# Patient Record
Sex: Male | Born: 1975
Health system: Southern US, Community
[De-identification: ages and names within clinical notes are randomized; demographics above are authoritative.]

## PROBLEM LIST (undated history)

## (undated) DIAGNOSIS — K602 Anal fissure, unspecified: Secondary | ICD-10-CM

## (undated) DIAGNOSIS — E039 Hypothyroidism, unspecified: Secondary | ICD-10-CM

## (undated) HISTORY — PX: NASAL SEPTUM SURGERY: SHX37

---

## 2016-01-22 ENCOUNTER — Other Ambulatory Visit: Payer: Self-pay | Admitting: Family Medicine

## 2016-01-22 ENCOUNTER — Ambulatory Visit
Admission: RE | Admit: 2016-01-22 | Discharge: 2016-01-22 | Disposition: A | Discharge status: Home / Self Care | Payer: BLUE CROSS/BLUE SHIELD | Source: Ambulatory Visit | Attending: Family Medicine | Admitting: Family Medicine

## 2016-01-22 DIAGNOSIS — J209 Acute bronchitis, unspecified: Secondary | ICD-10-CM

## 2017-06-03 DIAGNOSIS — L723 Sebaceous cyst: Secondary | ICD-10-CM | POA: Diagnosis not present

## 2017-06-03 DIAGNOSIS — R1909 Other intra-abdominal and pelvic swelling, mass and lump: Secondary | ICD-10-CM | POA: Diagnosis not present

## 2017-06-10 DIAGNOSIS — L723 Sebaceous cyst: Secondary | ICD-10-CM | POA: Diagnosis not present

## 2017-06-17 DIAGNOSIS — E039 Hypothyroidism, unspecified: Secondary | ICD-10-CM | POA: Diagnosis not present

## 2017-08-19 DIAGNOSIS — S60221A Contusion of right hand, initial encounter: Secondary | ICD-10-CM | POA: Diagnosis not present

## 2017-10-10 DIAGNOSIS — Z0000 Encounter for general adult medical examination without abnormal findings: Secondary | ICD-10-CM | POA: Diagnosis not present

## 2017-10-10 DIAGNOSIS — Z136 Encounter for screening for cardiovascular disorders: Secondary | ICD-10-CM | POA: Diagnosis not present

## 2017-10-10 DIAGNOSIS — R740 Nonspecific elevation of levels of transaminase and lactic acid dehydrogenase [LDH]: Secondary | ICD-10-CM | POA: Diagnosis not present

## 2017-10-10 DIAGNOSIS — E039 Hypothyroidism, unspecified: Secondary | ICD-10-CM | POA: Diagnosis not present

## 2017-10-10 DIAGNOSIS — Z23 Encounter for immunization: Secondary | ICD-10-CM | POA: Diagnosis not present

## 2017-12-26 DIAGNOSIS — S31831A Laceration without foreign body of anus, initial encounter: Secondary | ICD-10-CM | POA: Diagnosis not present

## 2017-12-29 DIAGNOSIS — R740 Nonspecific elevation of levels of transaminase and lactic acid dehydrogenase [LDH]: Secondary | ICD-10-CM | POA: Diagnosis not present

## 2018-06-12 DIAGNOSIS — E039 Hypothyroidism, unspecified: Secondary | ICD-10-CM | POA: Diagnosis not present

## 2018-07-02 DIAGNOSIS — K602 Anal fissure, unspecified: Secondary | ICD-10-CM | POA: Diagnosis not present

## 2018-08-21 DIAGNOSIS — E039 Hypothyroidism, unspecified: Secondary | ICD-10-CM | POA: Diagnosis not present

## 2018-10-23 DIAGNOSIS — Z0000 Encounter for general adult medical examination without abnormal findings: Secondary | ICD-10-CM | POA: Diagnosis not present

## 2018-11-04 DIAGNOSIS — K611 Rectal abscess: Secondary | ICD-10-CM | POA: Diagnosis not present

## 2018-11-06 DIAGNOSIS — Z0000 Encounter for general adult medical examination without abnormal findings: Secondary | ICD-10-CM | POA: Diagnosis not present

## 2018-11-06 DIAGNOSIS — E7889 Other lipoprotein metabolism disorders: Secondary | ICD-10-CM | POA: Diagnosis not present

## 2018-11-06 DIAGNOSIS — E039 Hypothyroidism, unspecified: Secondary | ICD-10-CM | POA: Diagnosis not present

## 2018-11-12 DIAGNOSIS — K602 Anal fissure, unspecified: Secondary | ICD-10-CM | POA: Diagnosis not present

## 2018-11-17 DIAGNOSIS — K603 Anal fistula: Secondary | ICD-10-CM | POA: Diagnosis not present

## 2018-11-17 DIAGNOSIS — K602 Anal fissure, unspecified: Secondary | ICD-10-CM | POA: Diagnosis not present

## 2018-11-27 ENCOUNTER — Ambulatory Visit: Payer: Self-pay | Admitting: Surgery

## 2018-11-27 NOTE — H&P (Signed)
CC: Referred by Dr. Evette Cristal for anal fissure, possible fistula  HPI: Mr. Uhrich is a very pleasant 43yoM with hx of anal pain that started one year ago following a hard bowel movement and is described as knifelike and sharp with bowel movements steadily improved after passing the stool. This lasted for a couple of weeks and then subsequently resolved. He had another episode of this kind of pain 05/2018 with similar recovery. Over the last month or so he has had intermittent swelling/lump that he points to his being in the posterior midline with associated small amounts of purulent drainage. He denies any fever/chills or significant swelling today. He recently started taking Metamucil daily. He has never had a colonoscopy but is following with Dr. Evette Cristal with GI.  PMH: Anal fissure; denies any other health hx or issues  PSH: Denies any prior anorectal procedures  FHx: Denies FHx of malignancy  Social: Denies use of tobacco/drugs; occasional social EtOH use; he works with [employer]  ROS: A comprehensive 10 system review of systems was completed with the patient and pertinent findings as noted above.  The patient is a 43 year old male.   Past Surgical History Adela Lank [place], RMA; 11/17/2018 11:18 AM) No pertinent past surgical history   Diagnostic Studies History Adela Lank [place], RMA; 11/17/2018 11:18 AM) Colonoscopy  never  Allergies Adela Lank [place], RMA; 11/17/2018 11:19 AM) Clarithromycin *CHEMICALS*  Tinnitus Allergies Reconciled   Medication History (Jacqueline Haggett, RMA; 11/17/2018 11:20 AM) Levothyroxine Sodium ( Tablet, Oral) Active. Medications Reconciled  Social History Adela Lank [place], RMA; 11/17/2018 11:18 AM) Alcohol use  Occasional alcohol use. Caffeine use  Carbonated beverages, Coffee, Tea. No drug use  Tobacco use  Never smoker.  Family History Adela Lank [place], [place]; 11/17/2018 11:18 AM) First Degree Relatives  No pertinent  family history   Other Problems Adela Lank [place], RMA; 11/17/2018 11:18 AM) Thyroid Disease     Review of Systems Adela Lank Hag[place] RMA; 11/17/2018 11:18 AM) General Not Present- Appetite Loss, Chills, Fatigue, Fever, Night Sweats, Weight Gain and Weight Loss. Skin Present- Dryness. Not Present- Change in Wart/Mole, Hives, Jaundice, New Lesions, Non-Healing Wounds, Rash and Ulcer. HEENT Not Present- Earache, Hearing Loss, Hoarseness, Nose Bleed, Oral Ulcers, Ringing in the Ears, Seasonal Allergies, Sinus Pain, Sore Throat, Visual Disturbances, Wears glasses/contact lenses and Yellow Eyes. Respiratory Not Present- Bloody sputum, Chronic Cough, Difficulty Breathing, Snoring and Wheezing. Cardiovascular Not Present- Chest Pain, Difficulty Breathing Lying Down, Leg Cramps, Palpitations, Rapid Heart Rate, Shortness of Breath and Swelling of Extremities. Gastrointestinal Present- Rectal Pain. Not Present- Abdominal Pain, Bloating, Bloody Stool, Change in Bowel Habits, Chronic diarrhea, Constipation, Difficulty Swallowing, Excessive gas, Gets full quickly at meals, Hemorrhoids, Indigestion, Nausea and Vomiting. Male Genitourinary Not Present- Blood in Urine, Change in Urinary Stream, Frequency, Impotence, Nocturia, Painful Urination, Urgency and Urine Leakage. Musculoskeletal Not Present- Back Pain, Joint Pain, Joint Stiffness, Muscle Pain, Muscle Weakness and Swelling of Extremities. Neurological Not Present- Decreased Memory, Fainting, Headaches, Numbness, Seizures, Tingling, Tremor, Trouble walking and Weakness. Psychiatric Not Present- Anxiety, Bipolar, Change in Sleep Pattern, Depression, Fearful and Frequent crying. Endocrine Not Present- Cold Intolerance, Excessive Hunger, Hair Changes, Heat Intolerance, Hot flashes and New Diabetes. Hematology Not Present- Blood Thinners, Easy Bruising, Excessive bleeding, Gland problems, HIV and Persistent Infections.  Vitals Adela Lank [place] RMA;  11/17/2018 11:20 AM) 11/17/2018 11:20 AM Weight: 195.8 lb Height: 69in Body Surface Area: 2.05 m Body Mass Index: 28.91 kg/m  Temp.: 98.30F(Temporal)  Pulse: 123 (Regular)  P.OX: 97% (Room air) BP: 118/78 (Sitting,  Left Arm, Standard)       Physical Exam Harrell Gave M. Lark Runk MD; 11/17/2018 12:26 PM) The physical exam findings are as follows: Note:Constitutional: No acute distress; conversant; no deformities; wearing surgical mask Eyes: Moist conjunctiva; no lid lag; anicteric sclerae; perrl Neck: Trachea midline; no palpable thyromegaly Lungs: Normal respiratory effort; no tactile fremitus CV: rrr; no palpable thrill; no pitting edema GI: Abdomen soft, nontender, nondistended; no palpable hepatosplenomegaly Anorectal: Small skin tags; birth mark right upper gluteal region; posterior midline external openeing vs small anal fissure. DRE - normal tone; no palpable masses Anoscopy: Circumferential anoscopy limited due to discomfort but, posterior midline, there is a 4 mm tear/external opening with scant amount of purulent drainage. MSK: Normal gait; no clubbing/cyanosis Psychiatric: Appropriate affect; alert and oriented 3 Lymphatic: No palpable cervical or axillary lymphadenopathy **A chaperone, [employer], was present for this encounter    Assessment & Plan Harrell Gave M. Tamala Manzer MD; 11/17/2018 12:26 PM) ANAL FISSURE AND FISTULA (K60.2) Story: 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 The pathophysiology of anal fissures & fistulas was discussed with associated illustrations using the ASCRS handout on anal fissure. Additionally we reviewed the hemorrhoid handout - continuing daily metamucil, drinking 64oz water per day and minimizing time on commode to  2-3 minutes -Given his symptoms and intermittent purulent drainage, we discussed he likely has now developed a fistula at the site of his prior fissure; we discussed proceeding with empiric treatment as well with topical nifedipine and provided a prescription for this today. -We discussed that it is unlikely that his fistula-like symptoms will resolve with medical management. We therefore discussed anorectal examining anesthesia and possible fistulotomy based on intraoperative findings. We discussed that in most cases this is a very short fistula tract. We did discuss that given his current symptoms which include no real anal pain or anal fissure like symptoms which he had in the past, not further complicating the issue with a lateral internal sphincterotomy. We did discuss the down the road if he were to have recurrent anal fissure-like problems, the potential need for an additional procedure although likely that this will all improve with fiber/water/topical nifedipine -The planned procedure, material risks (including, but not limited to, pain, bleeding, infection, scarring, need for blood transfusion, damage to anal sphincter, incontinence of gas and/or stool, need for additional procedures, recurrence, pneumonia, heart attack, stroke, death) benefits and alternatives to surgery were discussed at length. I noted a good probability that the procedure would help improve his symptoms. The patient's questions were answered to his satisfaction, he voiced understanding and elected to proceed with surgery. Additionally, we discussed typical postoperative expectations and recovery process. Current Plans ANOSCOPY, DIAGNOSTIC [phone]) (Anoscopy: Circumferential anoscopy limited due to discomfort but, posterior midline, there is a 4 mm tear/external opening with scant amount of purulent drainage.) Signed by Ileana Roup, MD (11/17/2018 12:26 PM)

## 2018-11-27 NOTE — H&P (View-Only) (Signed)
CC: Referred by Dr. Evette Cristal for anal fissure, possible fistula  HPI: Colton Barber is a very pleasant 43yoM with hx of anal pain that started one year ago following a hard bowel movement and is described as knifelike and sharp with bowel movements steadily improved after passing the stool. This lasted for a couple of weeks and then subsequently resolved. He had another episode of this kind of pain 05/2018 with similar recovery. Over the last month or so he has had intermittent swelling/lump that he points to his being in the posterior midline with associated small amounts of purulent drainage. He denies any fever/chills or significant swelling today. He recently started taking Metamucil daily. He has never had a colonoscopy but is following with Dr. Evette Cristal with GI.  PMH: Anal fissure; denies any other health hx or issues  PSH: Denies any prior anorectal procedures  FHx: Denies FHx of malignancy  Social: Denies use of tobacco/drugs; occasional social EtOH use; he works with [employer]  ROS: A comprehensive 10 system review of systems was completed with the patient and pertinent findings as noted above.  The patient is a 43 year old male.   Past Surgical History Colton Barber [place], RMA; 11/17/2018 11:18 AM) No pertinent past surgical history   Diagnostic Studies History Colton Barber [place], RMA; 11/17/2018 11:18 AM) Colonoscopy  never  Allergies Colton Barber Barber, RMA; 11/17/2018 11:19 AM) Clarithromycin *CHEMICALS*  Tinnitus Allergies Reconciled   Medication History (Colton Barber, RMA; 11/17/2018 11:20 AM) Levothyroxine Sodium ( Tablet, Oral) Active. Medications Reconciled  Social History Colton Barber [place], RMA; 11/17/2018 11:18 AM) Alcohol use  Occasional alcohol use. Caffeine use  Carbonated beverages, Coffee, Tea. No drug use  Tobacco use  Never smoker.  Family History Colton Barber [place], [place]; 11/17/2018 11:18 AM) First Degree Relatives  No pertinent  family history   Other Problems Colton Barber [place], RMA; 11/17/2018 11:18 AM) Thyroid Disease     Review of Systems Colton Barber Barber RMA; 11/17/2018 11:18 AM) General Not Present- Appetite Loss, Chills, Fatigue, Fever, Night Sweats, Weight Gain and Weight Loss. Skin Present- Dryness. Not Present- Change in Wart/Mole, Hives, Jaundice, New Lesions, Non-Healing Wounds, Rash and Ulcer. HEENT Not Present- Earache, Hearing Loss, Hoarseness, Nose Bleed, Oral Ulcers, Ringing in the Ears, Seasonal Allergies, Sinus Pain, Sore Throat, Visual Disturbances, Wears glasses/contact lenses and Yellow Eyes. Respiratory Not Present- Bloody sputum, Chronic Cough, Difficulty Breathing, Snoring and Wheezing. Cardiovascular Not Present- Chest Pain, Difficulty Breathing Lying Down, Leg Cramps, Palpitations, Rapid Heart Rate, Shortness of Breath and Swelling of Extremities. Gastrointestinal Present- Rectal Pain. Not Present- Abdominal Pain, Bloating, Bloody Stool, Change in Bowel Habits, Chronic diarrhea, Constipation, Difficulty Swallowing, Excessive gas, Gets full quickly at meals, Hemorrhoids, Indigestion, Nausea and Vomiting. Male Genitourinary Not Present- Blood in Urine, Change in Urinary Stream, Frequency, Impotence, Nocturia, Painful Urination, Urgency and Urine Leakage. Musculoskeletal Not Present- Back Pain, Joint Pain, Joint Stiffness, Muscle Pain, Muscle Weakness and Swelling of Extremities. Neurological Not Present- Decreased Memory, Fainting, Headaches, Numbness, Seizures, Tingling, Tremor, Trouble walking and Weakness. Psychiatric Not Present- Anxiety, Bipolar, Change in Sleep Pattern, Depression, Fearful and Frequent crying. Endocrine Not Present- Cold Intolerance, Excessive Hunger, Hair Changes, Heat Intolerance, Hot flashes and New Diabetes. Hematology Not Present- Blood Thinners, Easy Bruising, Excessive bleeding, Gland problems, HIV and Persistent Infections.  Vitals Colton Barber Barber RMA;  11/17/2018 11:20 AM) 11/17/2018 11:20 AM Weight: 195.8 lb Height: 69in Body Surface Area: 2.05 m Body Mass Index: 28.91 kg/m  Temp.: 98.30F(Temporal)  Pulse: 123 (Regular)  P.OX: 97% (Room air) BP: 118/78 (Sitting,  Left Arm, Standard)       Physical Exam Harrell Gave M. Rexann Lueras MD; 11/17/2018 12:26 PM) The physical exam findings are as follows: Note:Constitutional: No acute distress; conversant; no deformities; wearing surgical mask Eyes: Moist conjunctiva; no lid lag; anicteric sclerae; perrl Neck: Trachea midline; no palpable thyromegaly Lungs: Normal respiratory effort; no tactile fremitus CV: rrr; no palpable thrill; no pitting edema GI: Abdomen soft, nontender, nondistended; no palpable hepatosplenomegaly Anorectal: Small skin tags; birth mark right upper gluteal region; posterior midline external openeing vs small anal fissure. DRE - normal tone; no palpable masses Anoscopy: Circumferential anoscopy limited due to discomfort but, posterior midline, there is a 4 mm tear/external opening with scant amount of purulent drainage. MSK: Normal gait; no clubbing/cyanosis Psychiatric: Appropriate affect; alert and oriented 3 Lymphatic: No palpable cervical or axillary lymphadenopathy **A chaperone, [employer], was present for this encounter    Assessment & Plan Harrell Gave M. Navpreet Szczygiel MD; 11/17/2018 12:26 PM) ANAL FISSURE AND FISTULA (K60.2) Story: 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 The pathophysiology of anal fissures & fistulas was discussed with associated illustrations using the ASCRS handout on anal fissure. Additionally we reviewed the hemorrhoid handout - continuing daily metamucil, drinking 64oz water per day and minimizing time on commode to  2-3 minutes -Given his symptoms and intermittent purulent drainage, we discussed he likely has now developed a fistula at the site of his prior fissure; we discussed proceeding with empiric treatment as well with topical nifedipine and provided a prescription for this today. -We discussed that it is unlikely that his fistula-like symptoms will resolve with medical management. We therefore discussed anorectal examining anesthesia and possible fistulotomy based on intraoperative findings. We discussed that in most cases this is a very short fistula tract. We did discuss that given his current symptoms which include no real anal pain or anal fissure like symptoms which he had in the past, not further complicating the issue with a lateral internal sphincterotomy. We did discuss the down the road if he were to have recurrent anal fissure-like problems, the potential need for an additional procedure although likely that this will all improve with fiber/water/topical nifedipine -The planned procedure, material risks (including, but not limited to, pain, bleeding, infection, scarring, need for blood transfusion, damage to anal sphincter, incontinence of gas and/or stool, need for additional procedures, recurrence, pneumonia, heart attack, stroke, death) benefits and alternatives to surgery were discussed at length. I noted a good probability that the procedure would help improve his symptoms. The patient's questions were answered to his satisfaction, he voiced understanding and elected to proceed with surgery. Additionally, we discussed typical postoperative expectations and recovery process. Current Plans ANOSCOPY, DIAGNOSTIC [phone]) (Anoscopy: Circumferential anoscopy limited due to discomfort but, posterior midline, there is a 4 mm tear/external opening with scant amount of purulent drainage.) Signed by Ileana Roup, MD (11/17/2018 12:26 PM)

## 2018-11-28 ENCOUNTER — Other Ambulatory Visit (HOSPITAL_COMMUNITY)
Admission: RE | Admit: 2018-11-28 | Discharge: 2018-11-28 | Disposition: A | Discharge status: Home / Self Care | Payer: BC Managed Care – PPO | Source: Ambulatory Visit | Attending: Surgery | Admitting: Surgery

## 2018-11-28 DIAGNOSIS — Z20828 Contact with and (suspected) exposure to other viral communicable diseases: Secondary | ICD-10-CM | POA: Diagnosis not present

## 2018-11-28 DIAGNOSIS — Z01812 Encounter for preprocedural laboratory examination: Secondary | ICD-10-CM | POA: Insufficient documentation

## 2018-11-29 LAB — NOVEL CORONAVIRUS, NAA (HOSP ORDER, SEND-OUT TO REF LAB; TAT 18-24 HRS): SARS-CoV-2, NAA: NOT DETECTED

## 2018-11-30 ENCOUNTER — Encounter (HOSPITAL_BASED_OUTPATIENT_CLINIC_OR_DEPARTMENT_OTHER): Payer: Self-pay | Admitting: *Deleted

## 2018-11-30 ENCOUNTER — Other Ambulatory Visit: Payer: Self-pay

## 2018-11-30 NOTE — Progress Notes (Signed)
Spoke w/ via phone for pre-op interview---Colton Barber needs dos----   CBC WITH DIF, CMET, PT, PTT            Barber results------ COVID test ------11-28-2018 Arrive at -------1230 PM 12-02-2018 NPO after ------START CLEAR LIQUIDS AT 700 PM NIGHT BEFORE SURGERY WHEN TAKING MILK OF MAGNESIA PER DR WHITES INSTRUCTIONS, CLEAR LIQUIDS UNTIL 830 AM DAY OF SURGERY Medications to take morning of surgery -----LEVOTHYROXINE Diabetic medication -----N/A Patient Special Instructions ----- Pre-Op special Istructions -----FLEETS ENEMA HS AND AM OF SURGERY Patient verbalized understanding of instructions that were given at this phone interview. Patient denies shortness of breath, chest pain, fever, cough a this phone interview.

## 2018-12-02 ENCOUNTER — Ambulatory Visit (HOSPITAL_BASED_OUTPATIENT_CLINIC_OR_DEPARTMENT_OTHER)
Admission: RE | Admit: 2018-12-02 | Discharge: 2018-12-02 | Disposition: A | Discharge status: Home / Self Care | Payer: BC Managed Care – PPO | Attending: Surgery | Admitting: Surgery

## 2018-12-02 ENCOUNTER — Ambulatory Visit (HOSPITAL_BASED_OUTPATIENT_CLINIC_OR_DEPARTMENT_OTHER): Payer: BC Managed Care – PPO | Admitting: Certified Registered Nurse Anesthetist

## 2018-12-02 ENCOUNTER — Encounter (HOSPITAL_BASED_OUTPATIENT_CLINIC_OR_DEPARTMENT_OTHER)
Admission: RE | Disposition: A | Discharge status: Home / Self Care | Payer: Self-pay | Source: Home / Self Care | Attending: Surgery

## 2018-12-02 ENCOUNTER — Encounter (HOSPITAL_BASED_OUTPATIENT_CLINIC_OR_DEPARTMENT_OTHER): Payer: Self-pay | Admitting: Emergency Medicine

## 2018-12-02 ENCOUNTER — Other Ambulatory Visit: Payer: Self-pay

## 2018-12-02 DIAGNOSIS — Z79890 Hormone replacement therapy (postmenopausal): Secondary | ICD-10-CM | POA: Insufficient documentation

## 2018-12-02 DIAGNOSIS — K603 Anal fistula: Secondary | ICD-10-CM | POA: Insufficient documentation

## 2018-12-02 DIAGNOSIS — E039 Hypothyroidism, unspecified: Secondary | ICD-10-CM | POA: Diagnosis not present

## 2018-12-02 DIAGNOSIS — K602 Anal fissure, unspecified: Secondary | ICD-10-CM | POA: Diagnosis not present

## 2018-12-02 HISTORY — DX: Hypothyroidism, unspecified: E03.9

## 2018-12-02 HISTORY — DX: Anal fissure, unspecified: K60.2

## 2018-12-02 HISTORY — PX: FISTULOTOMY: SHX6413

## 2018-12-02 SURGERY — EXAM UNDER ANESTHESIA
Anesthesia: General | Site: Rectum

## 2018-12-02 MED ORDER — ACETAMINOPHEN 500 MG PO TABS
ORAL_TABLET | ORAL | Status: AC
  Filled 2018-12-02: qty 2

## 2018-12-02 MED ORDER — DIBUCAINE (PERIANAL) 1 % EX OINT
TOPICAL_OINTMENT | CUTANEOUS | Status: DC | PRN
  Administered 2018-12-02: 1 via RECTAL

## 2018-12-02 MED ORDER — BUPIVACAINE-EPINEPHRINE 0.25% -1:200000 IJ SOLN
INTRAMUSCULAR | Status: DC | PRN
  Administered 2018-12-02: 30 mL

## 2018-12-02 MED ORDER — CHLORHEXIDINE GLUCONATE CLOTH 2 % EX PADS
6.0000 | MEDICATED_PAD | Freq: Once | CUTANEOUS | Status: DC
  Filled 2018-12-02: qty 6

## 2018-12-02 MED ORDER — LACTATED RINGERS IV SOLN
INTRAVENOUS | Status: DC
  Administered 2018-12-02 (×2): via INTRAVENOUS
  Filled 2018-12-02: qty 1000

## 2018-12-02 MED ORDER — LIDOCAINE HCL (CARDIAC) PF 100 MG/5ML IV SOSY
PREFILLED_SYRINGE | INTRAVENOUS | Status: DC | PRN
  Administered 2018-12-02: 80 mg via INTRAVENOUS

## 2018-12-02 MED ORDER — HYDROMORPHONE HCL 1 MG/ML IJ SOLN
0.2500 mg | INTRAMUSCULAR | Status: DC | PRN
  Filled 2018-12-02: qty 0.5

## 2018-12-02 MED ORDER — TRAMADOL HCL 50 MG PO TABS: 50.0000 mg | ORAL_TABLET | Freq: Four times a day (QID) | ORAL | 0 refills | Status: AC | PRN

## 2018-12-02 MED ORDER — SUGAMMADEX SODIUM 200 MG/2ML IV SOLN
INTRAVENOUS | Status: DC | PRN
  Administered 2018-12-02: 180 mg via INTRAVENOUS

## 2018-12-02 MED ORDER — PROPOFOL 10 MG/ML IV BOLUS
INTRAVENOUS | Status: DC | PRN
  Administered 2018-12-02: 200 mg via INTRAVENOUS

## 2018-12-02 MED ORDER — PROMETHAZINE HCL 25 MG/ML IJ SOLN
6.2500 mg | INTRAMUSCULAR | Status: DC | PRN
  Filled 2018-12-02: qty 1

## 2018-12-02 MED ORDER — ROCURONIUM BROMIDE 10 MG/ML (PF) SYRINGE
PREFILLED_SYRINGE | INTRAVENOUS | Status: DC | PRN
  Administered 2018-12-02: 50 mg via INTRAVENOUS

## 2018-12-02 MED ORDER — MIDAZOLAM HCL 2 MG/2ML IJ SOLN
INTRAMUSCULAR | Status: DC | PRN
  Administered 2018-12-02: 2 mg via INTRAVENOUS

## 2018-12-02 MED ORDER — ONDANSETRON HCL 4 MG/2ML IJ SOLN
INTRAMUSCULAR | Status: DC | PRN
  Administered 2018-12-02: 4 mg via INTRAVENOUS

## 2018-12-02 MED ORDER — DEXAMETHASONE SODIUM PHOSPHATE 10 MG/ML IJ SOLN
INTRAMUSCULAR | Status: DC | PRN
  Administered 2018-12-02: 7 mg via INTRAVENOUS

## 2018-12-02 MED ORDER — FENTANYL CITRATE (PF) 250 MCG/5ML IJ SOLN
INTRAMUSCULAR | Status: DC | PRN
  Administered 2018-12-02: 100 ug via INTRAVENOUS
  Administered 2018-12-02: 50 ug via INTRAVENOUS

## 2018-12-02 MED ORDER — KETOROLAC TROMETHAMINE 30 MG/ML IJ SOLN
30.0000 mg | Freq: Once | INTRAMUSCULAR | Status: DC | PRN
  Filled 2018-12-02: qty 1

## 2018-12-02 MED ORDER — LIDOCAINE 2% (20 MG/ML) 5 ML SYRINGE
INTRAMUSCULAR | Status: AC
  Filled 2018-12-02: qty 5

## 2018-12-02 MED ORDER — MIDAZOLAM HCL 2 MG/2ML IJ SOLN
INTRAMUSCULAR | Status: AC
  Filled 2018-12-02: qty 2

## 2018-12-02 MED ORDER — ACETAMINOPHEN 500 MG PO TABS
1000.0000 mg | ORAL_TABLET | ORAL | Status: DC
  Filled 2018-12-02: qty 2

## 2018-12-02 MED ORDER — FENTANYL CITRATE (PF) 250 MCG/5ML IJ SOLN
INTRAMUSCULAR | Status: AC
  Filled 2018-12-02: qty 5

## 2018-12-02 MED ORDER — PROPOFOL 10 MG/ML IV BOLUS
INTRAVENOUS | Status: AC
  Filled 2018-12-02: qty 20

## 2018-12-02 MED ORDER — BUPIVACAINE LIPOSOME 1.3 % IJ SUSP
INTRAMUSCULAR | Status: DC | PRN
  Administered 2018-12-02: 20 mL

## 2018-12-02 MED ORDER — OXYCODONE HCL 5 MG/5ML PO SOLN
5.0000 mg | Freq: Once | ORAL | Status: DC | PRN
  Filled 2018-12-02: qty 5

## 2018-12-02 MED ORDER — OXYCODONE HCL 5 MG PO TABS
5.0000 mg | ORAL_TABLET | Freq: Once | ORAL | Status: DC | PRN
  Filled 2018-12-02: qty 1

## 2018-12-02 MED ORDER — ROCURONIUM BROMIDE 10 MG/ML (PF) SYRINGE
PREFILLED_SYRINGE | INTRAVENOUS | Status: AC
  Filled 2018-12-02: qty 10

## 2018-12-02 MED ORDER — BUPIVACAINE LIPOSOME 1.3 % IJ SUSP
20.0000 mL | Freq: Once | INTRAMUSCULAR | Status: DC
  Filled 2018-12-02: qty 20

## 2018-12-02 MED ORDER — ACETAMINOPHEN 500 MG PO TABS
1000.0000 mg | ORAL_TABLET | Freq: Once | ORAL | Status: AC
  Administered 2018-12-02: 1000 mg via ORAL
  Filled 2018-12-02: qty 2

## 2018-12-02 SURGICAL SUPPLY — 57 items
BENZOIN TINCTURE PRP APPL 2/3 (GAUZE/BANDAGES/DRESSINGS) ×3 IMPLANT
BLADE EXTENDED COATED 6.5IN (ELECTRODE) IMPLANT
BLADE HEX COATED 2.75 (ELECTRODE) ×3 IMPLANT
BLADE SURG 10 STRL SS (BLADE) ×3 IMPLANT
BLADE SURG 15 STRL LF DISP TIS (BLADE) ×1 IMPLANT
BLADE SURG 15 STRL SS (BLADE) ×2
BRIEF STRETCH FOR OB PAD LRG (UNDERPADS AND DIAPERS) ×3 IMPLANT
CANISTER SUCT 3000ML PPV (MISCELLANEOUS) ×3 IMPLANT
COVER BACK TABLE 60X90IN (DRAPES) ×3 IMPLANT
COVER MAYO STAND STRL (DRAPES) ×3 IMPLANT
DECANTER SPIKE VIAL GLASS SM (MISCELLANEOUS) IMPLANT
DRAPE LAPAROTOMY 100X72 PEDS (DRAPES) ×3 IMPLANT
DRAPE UTILITY XL STRL (DRAPES) ×3 IMPLANT
DRSG PAD ABDOMINAL 8X10 ST (GAUZE/BANDAGES/DRESSINGS) ×3 IMPLANT
GAUZE SPONGE 4X4 12PLY STRL (GAUZE/BANDAGES/DRESSINGS) ×3 IMPLANT
GAUZE SPONGE 4X4 12PLY STRL LF (GAUZE/BANDAGES/DRESSINGS) ×3 IMPLANT
GLOVE BIO SURGEON STRL SZ7.5 (GLOVE) ×3 IMPLANT
GLOVE INDICATOR 8.0 STRL GRN (GLOVE) ×3 IMPLANT
GOWN STRL REUS W/TWL LRG LVL3 (GOWN DISPOSABLE) ×6 IMPLANT
HYDROGEN PEROXIDE 16OZ (MISCELLANEOUS) IMPLANT
IV CATH 14GX2 1/4 (CATHETERS) ×3 IMPLANT
IV CATH 18G SAFETY (IV SOLUTION) IMPLANT
KIT SIGMOIDOSCOPE (SET/KITS/TRAYS/PACK) IMPLANT
KIT TURNOVER CYSTO (KITS) ×3 IMPLANT
LOOP VESSEL MAXI BLUE (MISCELLANEOUS) IMPLANT
NEEDLE HYPO 22GX1.5 SAFETY (NEEDLE) ×3 IMPLANT
NS IRRIG 500ML POUR BTL (IV SOLUTION) ×3 IMPLANT
PACK BASIN DAY SURGERY FS (CUSTOM PROCEDURE TRAY) ×3 IMPLANT
PAD ABD 8X10 STRL (GAUZE/BANDAGES/DRESSINGS) ×3 IMPLANT
PENCIL BUTTON HOLSTER BLD 10FT (ELECTRODE) ×3 IMPLANT
SPONGE HEMORRHOID 8X3CM (HEMOSTASIS) IMPLANT
SPONGE SURGIFOAM ABS GEL 12-7 (HEMOSTASIS) IMPLANT
SUCTION FRAZIER HANDLE 10FR (MISCELLANEOUS)
SUCTION TUBE FRAZIER 10FR DISP (MISCELLANEOUS) IMPLANT
SUT CHROMIC 2 0 SH (SUTURE) IMPLANT
SUT CHROMIC 3 0 SH 27 (SUTURE) IMPLANT
SUT MNCRL AB 4-0 PS2 18 (SUTURE) IMPLANT
SUT SILK 0 PSL NDL (SUTURE) IMPLANT
SUT SILK 0 TIES 10X30 (SUTURE) IMPLANT
SUT SILK 2 0 (SUTURE)
SUT SILK 2-0 18XBRD TIE 12 (SUTURE) IMPLANT
SUT VIC AB 2-0 SH 27 (SUTURE)
SUT VIC AB 2-0 SH 27XBRD (SUTURE) IMPLANT
SUT VIC AB 3-0 SH 18 (SUTURE) IMPLANT
SUT VIC AB 3-0 SH 27 (SUTURE)
SUT VIC AB 3-0 SH 27X BRD (SUTURE) IMPLANT
SUT VIC AB 3-0 SH 27XBRD (SUTURE) IMPLANT
SUT VIC AB 4-0 P-3 18XBRD (SUTURE) IMPLANT
SUT VIC AB 4-0 P3 18 (SUTURE)
SYR 20ML LL LF (SYRINGE) IMPLANT
SYR BULB IRRIGATION 50ML (SYRINGE) ×3 IMPLANT
SYR CONTROL 10ML LL (SYRINGE) ×3 IMPLANT
TOWEL OR 17X26 10 PK STRL BLUE (TOWEL DISPOSABLE) ×3 IMPLANT
TRAY DSU PREP LF (CUSTOM PROCEDURE TRAY) ×3 IMPLANT
TUBE CONNECTING 12'X1/4 (SUCTIONS) ×1
TUBE CONNECTING 12X1/4 (SUCTIONS) ×2 IMPLANT
YANKAUER SUCT BULB TIP NO VENT (SUCTIONS) ×3 IMPLANT

## 2018-12-02 NOTE — Progress Notes (Signed)
Patient given sitz bath for home use.

## 2018-12-02 NOTE — Discharge Instructions (Addendum)
ANORECTAL SURGERY: POST OP INSTRUCTIONS You had a short segment and shallow fistula from your prior anal fissure - therefore, a "fistulotomy" was performed  1. DIET: Follow a light bland diet the first 24 hours after arrival home, such as soup, liquids, crackers, etc.  Be sure to include lots of fluids daily.  Avoid fast food or heavy meals as your are more likely to get nauseated.  Eat a low fat diet the next few days after surgery.    2. Take your usually prescribed home medications unless otherwise directed.  3. PAIN CONTROL: a. It is helpful to take an over-the-counter pain medication regularly for the first few days/weeks.  Choose from the following that works best for you: i. Ibuprofen (Advil, etc) Three 200mg  tabs every 6 hours as needed. ii. Acetaminophen (Tylenol, etc) 500-650mg  every 6 hours as needed iii. NOTE: You may take both of these medications together - most patients find it most helpful when alternating between the two (i.e. Ibuprofen at 6am, tylenol at 9am, ibuprofen at 12pm ...) b. A  prescription for pain medication may have been prescribed for you at discharge.  Take your pain medication as prescribed.  i. If you are having problems/concerns with the prescription medicine, please call us for further advice.  4. Avoid getting constipated.  Between the surgery and the pain medications, it is common to experience some constipation.  Increasing fluid intake (64oz of water per day) and taking a fiber supplement (such as Metamucil, Citrucel, FiberCon) 1-2 times a day regularly will usually help prevent this problem from occurring.  Take Miralax (over the counter) 1-2x/day while taking a narcotic pain medication. If no bowel movement after 48hours, you may additionally take a laxative like a bottle of Milk of Magnesia which can be purchased over the counter. Avoid enemas if possible as these are often painful.   5. Watch out for diarrhea.  If you have many loose bowel movements,  simplify your diet to bland foods.  Stop any stool softeners and decrease your fiber supplement. If this worsens or does not improve, please call us.  6. Wash / shower every day.  If you were discharged with a dressing, you may remove this the day after your surgery. You may shower normally, getting soap/water on your wound, particularly after bowel movements.  7. Soaking in a warm bath filled a couple inches ("Sitz bath") is a great way to clean the area after a bowel movement and many patients find it is a way to soothe the area.  8. ACTIVITIES as tolerated:   a. You may resume regular (light) daily activities beginning the next day--such as daily self-care, walking, climbing stairs--gradually increasing activities as tolerated.  If you can walk 30 minutes without difficulty, it is safe to try more intense activity such as jogging, treadmill, bicycling, low-impact aerobics, etc. b. Refrain from any heavy lifting or straining for the first 2 weeks after your procedure, particularly if your surgery was for hemorrhoids. c. Avoid activities that make your pain worse d. You may drive when you are no longer taking prescription pain medication, you can comfortably wear a seatbelt, and you can safely maneuver your car and apply brakes.  9. FOLLOW UP in our office a. Please call [facility] at [phone] to set up an appointment to see your surgeon in the office for a follow-up appointment approximately 2 weeks after your surgery. b. Make sure that you call for this appointment the day you arrive home to insure  a convenient appointment time.  9. If you have disability or family leave forms that need to be completed, you may have them completed by your primary care physician's office; for return to work instructions, please ask our office staff and they will be happy to assist you in obtaining this documentation   When to call us [phone]: 1. Poor pain control 2. Reactions / problems with new  medications (rash/itching, etc)  3. Fever over 101.5 F (38.5 C) 4. Inability to urinate 5. Nausea/vomiting 6. Worsening swelling or bruising 7. Continued bleeding from incision. 8. Increased pain, redness, or drainage from the incision  The clinic staff is available to answer your questions during regular business hours (8:30am-5pm).  Please dont hesitate to call and ask to speak to one of our nurses for clinical concerns.   A surgeon from [facility] is always on call at the hospitals   If you have a medical emergency, go to the nearest emergency room or call 911.   [facility] [address], [place], [place]  [address] ? MAIN: [phone] FAX [phone] [email or web address]    Post Anesthesia Home Care Instructions  Activity: Get plenty of rest for the remainder of the day. A responsible individual must stay with you for 24 hours following the procedure.  For the next 24 hours, DO NOT: -Drive a car -Advertising copywriter -Drink alcoholic beverages -Take any medication unless instructed by your physician -Make any legal decisions or sign important papers.  Meals: Start with liquid foods such as gelatin or soup. Progress to regular foods as tolerated. Avoid greasy, spicy, heavy foods. If nausea and/or vomiting occur, drink only clear liquids until the nausea and/or vomiting subsides. Call your physician if vomiting continues.  Special Instructions/Symptoms: Your throat may feel dry or sore from the anesthesia or the breathing tube placed in your throat during surgery. If this causes discomfort, gargle with warm salt water. The discomfort should disappear within 24 hours.  Information for Discharge Teaching: EXPAREL (bupivacaine liposome injectable suspension)   Your surgeon or anesthesiologist gave you EXPAREL(bupivacaine) to help control your pain after surgery.   EXPAREL is a local anesthetic that provides pain  relief by numbing the tissue around the surgical site.  EXPAREL is designed to release pain medication over time and can control pain for up to 72 hours.  Depending on how you respond to EXPAREL, you may require less pain medication during your recovery.  Possible side effects:  Temporary loss of sensation or ability to move in the area where bupivacaine was injected.  Nausea, vomiting, constipation  Rarely, numbness and tingling in your mouth or lips, lightheadedness, or anxiety may occur.  Call your doctor right away if you think you may be experiencing any of these sensations, or if you have other questions regarding possible side effects.  Follow all other discharge instructions given to you by your surgeon or nurse. Eat a healthy diet and drink plenty of water or other fluids.  If you return to the hospital for any reason within 96 hours following the administration of EXPAREL, it is important for health care providers to know that you have received this anesthetic. A teal colored band has been placed on your arm with the date, time and amount of EXPAREL you have received in order to alert and inform your health care providers. Please leave this armband in place for the full 96 hours following administration, and then you may remove the  band.

## 2018-12-02 NOTE — Transfer of Care (Signed)
Immediate Anesthesia Transfer of Care Note  Patient: Colton Barber  Procedure(s) Performed: ANORECTAL EXAM UNDER ANESTHESIA (N/A Rectum) FISTULOTOMY (N/A Rectum)  Patient Location: PACU  Anesthesia Type:General  Level of Consciousness: awake, alert , oriented, drowsy and patient cooperative  Airway & Oxygen Therapy: Patient Spontanous Breathing and Patient connected to nasal cannula oxygen  Post-op Assessment: Report given to RN and Post -op Vital signs reviewed and stable  Post vital signs: Reviewed and stable  Last Vitals:  Vitals Value Taken Time  BP 126/77 12/02/18 1500  Temp    Pulse 117 12/02/18 1503  Resp 19 12/02/18 1503  SpO2 95 % 12/02/18 1503  Vitals shown include unvalidated device data.  Last Pain:  Vitals:   12/02/18 1246  TempSrc: Oral      Patients Stated Pain Goal: 3 (01/75/10 2585)  Complications: No apparent anesthesia complications

## 2018-12-02 NOTE — Anesthesia Preprocedure Evaluation (Addendum)
Anesthesia Evaluation  Patient identified by MRN, date of birth, ID band Patient awake    Reviewed: Allergy & Precautions, NPO status , Patient's Chart, lab work & pertinent test results  Airway Mallampati: II  TM Distance: >3 FB Neck ROM: Full    Dental no notable dental hx.    Pulmonary neg pulmonary ROS,    Pulmonary exam normal breath sounds clear to auscultation       Cardiovascular negative cardio ROS Normal cardiovascular exam Rhythm:Regular Rate:Normal     Neuro/Psych negative neurological ROS  negative psych ROS   GI/Hepatic negative GI ROS, Neg liver ROS,   Endo/Other  Hypothyroidism   Renal/GU negative Renal ROS     Musculoskeletal negative musculoskeletal ROS (+)   Abdominal   Peds  Hematology negative hematology ROS (+)   Anesthesia Other Findings ANAL FISSURE WITH FISTULA  Reproductive/Obstetrics                            Anesthesia Physical Anesthesia Plan  ASA: II  Anesthesia Plan: General   Post-op Pain Management:    Induction: Intravenous  PONV Risk Score and Plan: 2 and Ondansetron, Dexamethasone, Midazolam and Treatment may vary due to age or medical condition  Airway Management Planned: Oral ETT  Additional Equipment:   Intra-op Plan:   Post-operative Plan: Extubation in OR  Informed Consent: I have reviewed the patients History and Physical, chart, labs and discussed the procedure including the risks, benefits and alternatives for the proposed anesthesia with the patient or authorized representative who has indicated his/her understanding and acceptance.     Dental advisory given  Plan Discussed with: CRNA  Anesthesia Plan Comments:        Anesthesia Quick Evaluation

## 2018-12-02 NOTE — Interval H&P Note (Signed)
History and Physical Interval Note:  12/02/2018 2:14 PM  Colton Barber  has presented today for surgery, with the diagnosis of [place].  The various methods of treatment have been discussed with the patient at bedside this afternoon. After consideration of the material risks, benefits and other options for treatment, the patient has consented to  Procedure(s): ANORECTAL EXAM UNDER ANESTHESIA (N/A) POSSIBLE FISTULOTOMY (N/A) as a surgical intervention.  The patient's history has been reviewed, patient examined, no change in status, stable for surgery.  I have reviewed the patient's chart and labs.  Questions were answered to the patient's satisfaction, he expressed understanding and has elected to proceed with surgery.  Additionally, we discussed potential for seton placement if the fistula which we suspect as being there were to be found to be deeper (I.e. transsphincteric). We discussed that in that scenario, patients often require an additional procedure to address as well.  [place]

## 2018-12-02 NOTE — Anesthesia Procedure Notes (Signed)
Procedure Name: Intubation Date/Time: 12/02/2018 2:25 PM Performed by: Raenette Rover, CRNA Pre-anesthesia Checklist: Patient identified, Emergency Drugs available, Suction available and Patient being monitored Patient Re-evaluated:Patient Re-evaluated prior to induction Oxygen Delivery Method: Circle system utilized Preoxygenation: Pre-oxygenation with 100% oxygen Induction Type: IV induction Ventilation: Mask ventilation without difficulty Laryngoscope Size: Miller and 3 Grade View: Grade I Tube type: Oral Tube size: 7.5 mm Number of attempts: 1 Airway Equipment and Method: Stylet Placement Confirmation: ETT inserted through vocal cords under direct vision,  positive ETCO2 and breath sounds checked- equal and bilateral Secured at: 22 cm Tube secured with: Tape Dental Injury: Teeth and Oropharynx as per pre-operative assessment

## 2018-12-02 NOTE — Anesthesia Postprocedure Evaluation (Signed)
Anesthesia Post Note  Patient: Colton Barber  Procedure(s) Performed: ANORECTAL EXAM UNDER ANESTHESIA (N/A Rectum) FISTULOTOMY (N/A Rectum)     Patient location during evaluation: PACU Anesthesia Type: General Level of consciousness: awake and alert Pain management: pain level controlled Vital Signs Assessment: post-procedure vital signs reviewed and stable Respiratory status: spontaneous breathing, nonlabored ventilation and respiratory function stable Cardiovascular status: blood pressure returned to baseline and stable Postop Assessment: no apparent nausea or vomiting Anesthetic complications: no    Last Vitals:  Vitals:   12/02/18 1548 12/02/18 1617  BP:  126/87  Pulse: 93 94  Resp: 16 16  Temp:  36.7 C  SpO2: 99% 94%    Last Pain:  Vitals:   12/02/18 1617  TempSrc:   PainSc: 0-No pain                 Audry Pili

## 2018-12-02 NOTE — Op Note (Signed)
12/02/2018  3:00 PM  PATIENT:  Colton Barber  43 y.o. male  Patient Care Team: Darrow Bussing, MD as PCP - General (Family Medicine)  PRE-OPERATIVE DIAGNOSIS:  Anal fistula; history of anal fissure  POST-OPERATIVE DIAGNOSIS:  Same  PROCEDURE:   1. Fistulotomy 2. Incision and drainage of perianal abscess 3. Anorectal exam under anesthesia  SURGEON:  Surgeon(s): Andria Meuse, MD  ANESTHESIA:   local and general  SPECIMEN:  No Specimen  DISPOSITION OF SPECIMEN:  N/A  COUNTS:  Sponge, needle, and instrument counts were reported correct x2 at conclusion.  EBL: 2 mL  Drains: None  PLAN OF CARE: Discharge to home after PACU  PATIENT DISPOSITION:  PACU - hemodynamically stable.  INDICATION: [name] is a very pleasant 43 year old male with a history of anal pain that began approximately 1 year ago following a hard bowel movement.  This was described as being knifelike and sharp.  Over time the steadily improved.  This had lasted for a few weeks.  He had another episode this kind of pain 6 months ago.  Over the last 1 month or so, he has had intermittent swelling/lump in the posterior midline with subsequent purulent drainage.  He had no fever/chills.  When he was seen in the office, he had no significant swelling at that time but did have intermittent drainage.  He recently started taking Metamucil.  He is following with Dr. Evette Cristal with GI as well.  He was seen and evaluated and found to have a area that appeared to be an external opening in the posterior midline consistent with a fistula likely related to his prior fissure and abscess.  We discussed options moving forward.  He opted to pursue surgery.  Please refer to notes elsewhere for details regarding this discussion.  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  The remainder of his anorectal exam was unremarkable.  The fistula he had was short and  shallow and almost entirely subcutaneous in nature.  Therefore a primary fistulotomy was carried out.  There was an associated abscess cavity here which was opened and drained and there is approximately 2 cc of pus present within it.   DESCRIPTION: The patient was identified in the preoperative holding area and taken to the OR.  SCDs were placed.  He then underwent general endotracheal anesthesia.  He was then rolled into the prone jackknife position on the operating table.  Pressure points were then evaluated and padded.  The buttocks were gently taped apart.  The perianal area was then prepped and draped in the standard sterile fashion.  A surgical timeout was performed indicating the correct patient, procedure, positioning.  A perianal block block was performed using a dilute mixture of Exparel with 0.25% Marcaine with epinephrine.    A well lubricated digital rectal exam was performed which demonstrated no palpable abnormalities aside from the posterior midline fissure.  A Hill-Ferguson anoscope was gently inserted into the anal canal and circumferential inspection demonstrated a chronic appearing posterior midline anal fissure but also a fistula.  There was an external opening in the posterior midline.  The fistula then terminated in the posterior midline well below the dentate line and appeared to be short and shallow.  This was cannulated with a fistula probe.  There is no significant amount of sphincter palpable over this tract.  The decision was therefore made to proceed with a primary fistulotomy.  The skin and subcutaneous  tissue was opened which resulted in a fistulotomy.  There is no sphincter muscle divided during this portion either.  An abscess cavity was encountered.  This had been opened.  2 cc of pus then drained.  The area was irrigated with sterile saline.  There was a fair amount of granulation tissue which was gently removed with a fine curette.  At this point, the wound base was healthy  in appearance without significant granulation tissue.  Hemostasis was then confirmed.  He had no symptoms from his fissure per se and given that a fistulotomy was being performed, no further surgery planned at this juncture.  Dibucaine ointment was applied to the perianal area.  A dressing consisting of 4 x 4's, ABD, and mesh underwear were then used.  The tape was then released.  He was then rolled back onto a stretcher, awakened from anesthesia, extubated, and transferred to recovery in satisfactory condition.  DISPOSITION: PACU in satisfactory condition.

## 2018-12-03 ENCOUNTER — Encounter (HOSPITAL_BASED_OUTPATIENT_CLINIC_OR_DEPARTMENT_OTHER): Payer: Self-pay | Admitting: Surgery

## 2021-05-16 ENCOUNTER — Other Ambulatory Visit: Payer: Self-pay | Admitting: Family Medicine

## 2021-05-16 ENCOUNTER — Ambulatory Visit
Admission: RE | Admit: 2021-05-16 | Discharge: 2021-05-16 | Disposition: A | Discharge status: Home / Self Care | Payer: No Typology Code available for payment source | Source: Ambulatory Visit | Attending: Family Medicine | Admitting: Family Medicine

## 2021-05-16 DIAGNOSIS — R042 Hemoptysis: Secondary | ICD-10-CM

## 2022-05-15 ENCOUNTER — Other Ambulatory Visit (HOSPITAL_COMMUNITY): Payer: Self-pay | Admitting: Family Medicine

## 2022-05-15 DIAGNOSIS — E7800 Pure hypercholesterolemia, unspecified: Secondary | ICD-10-CM

## 2022-06-20 ENCOUNTER — Ambulatory Visit (HOSPITAL_BASED_OUTPATIENT_CLINIC_OR_DEPARTMENT_OTHER)
Admission: RE | Admit: 2022-06-20 | Discharge: 2022-06-20 | Disposition: A | Discharge status: Home / Self Care | Payer: No Typology Code available for payment source | Source: Ambulatory Visit | Attending: Family Medicine | Admitting: Family Medicine

## 2022-06-20 DIAGNOSIS — E7800 Pure hypercholesterolemia, unspecified: Secondary | ICD-10-CM | POA: Insufficient documentation

## 2023-07-13 IMAGING — CR DG CHEST 2V
2 series · 2 of 2 positions shown · non-contrast
Comparison: Chest x-ray 01/22/2016

CLINICAL DATA: Blood in sputum, cough.

EXAM:
CHEST - 2 VIEW

[w chest pa]
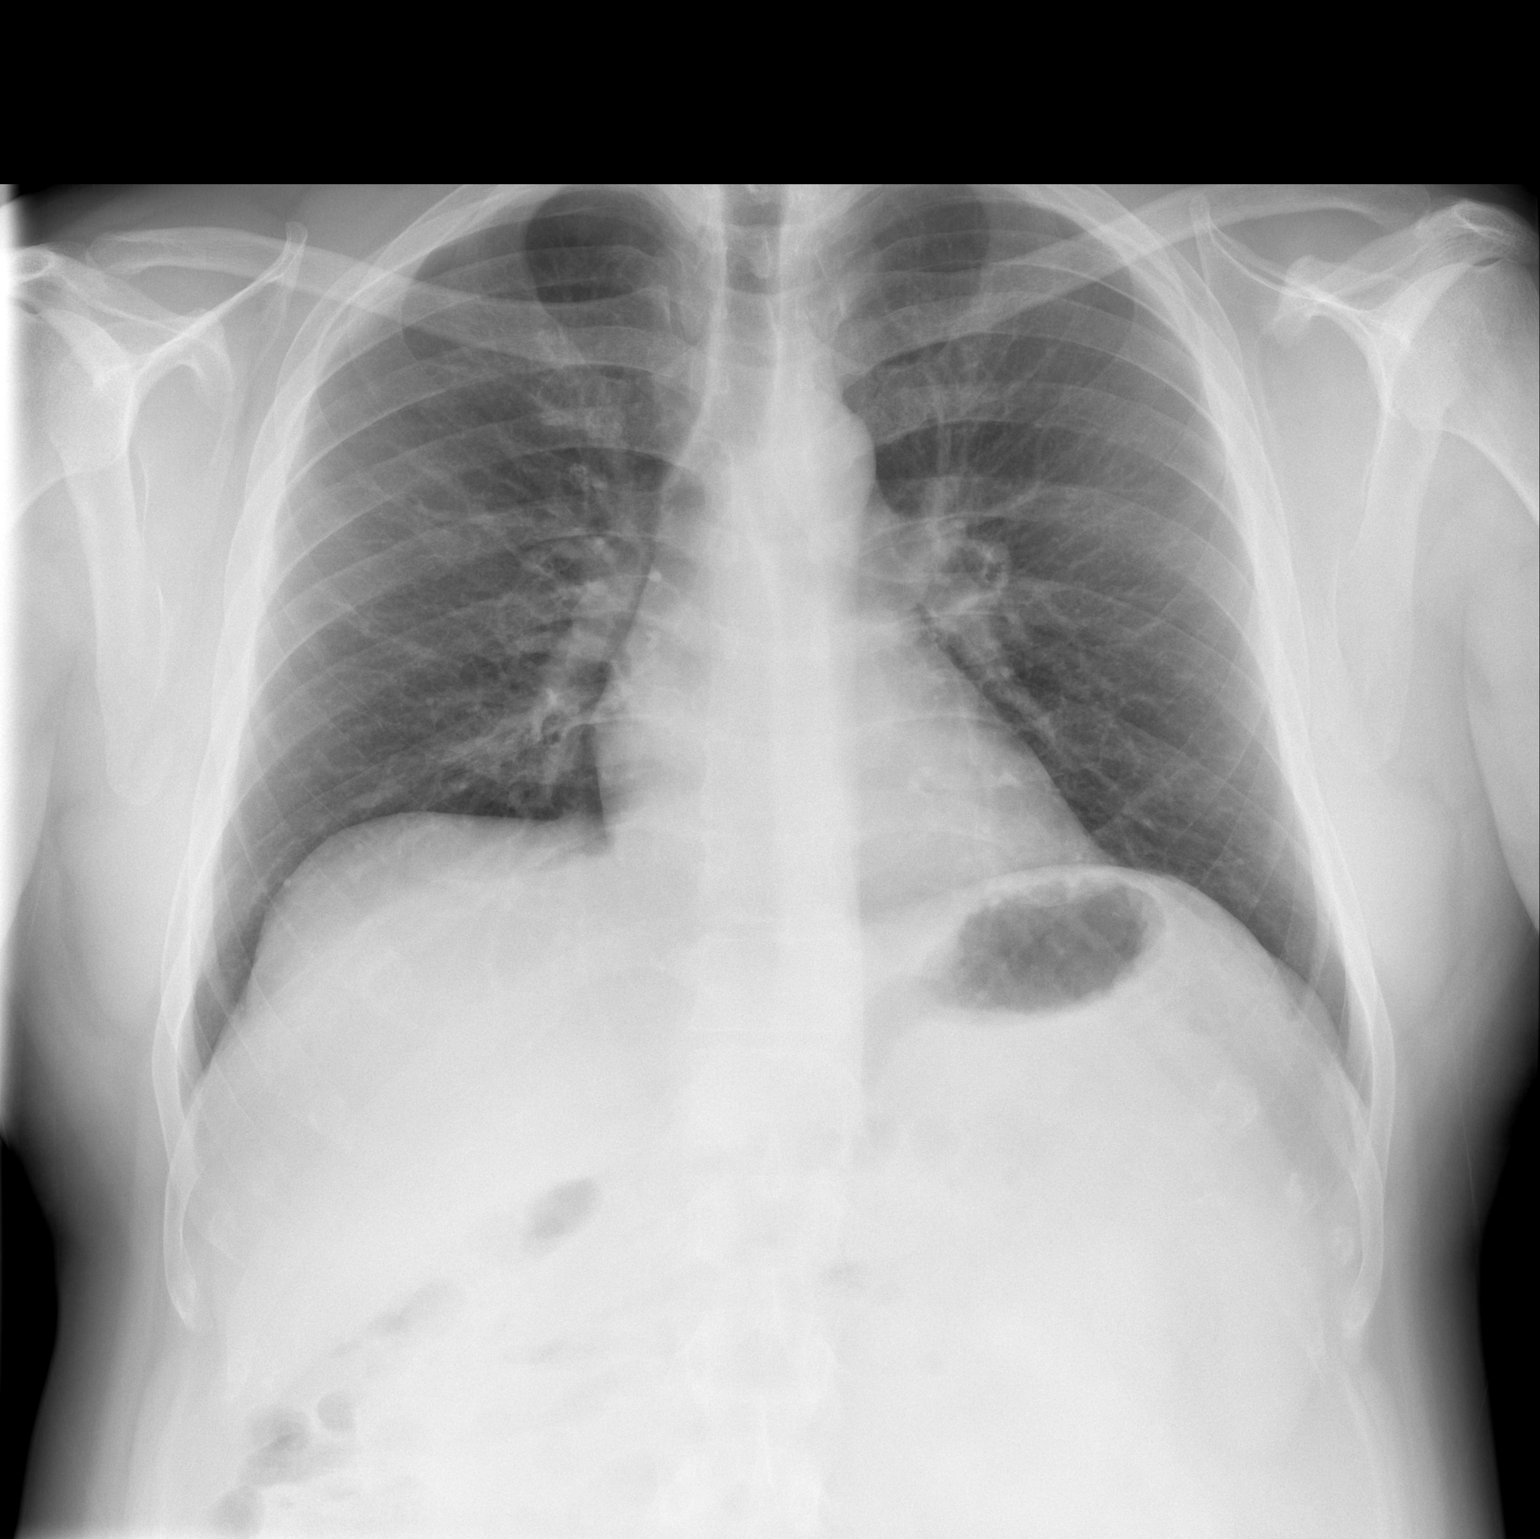

[w chest lat]
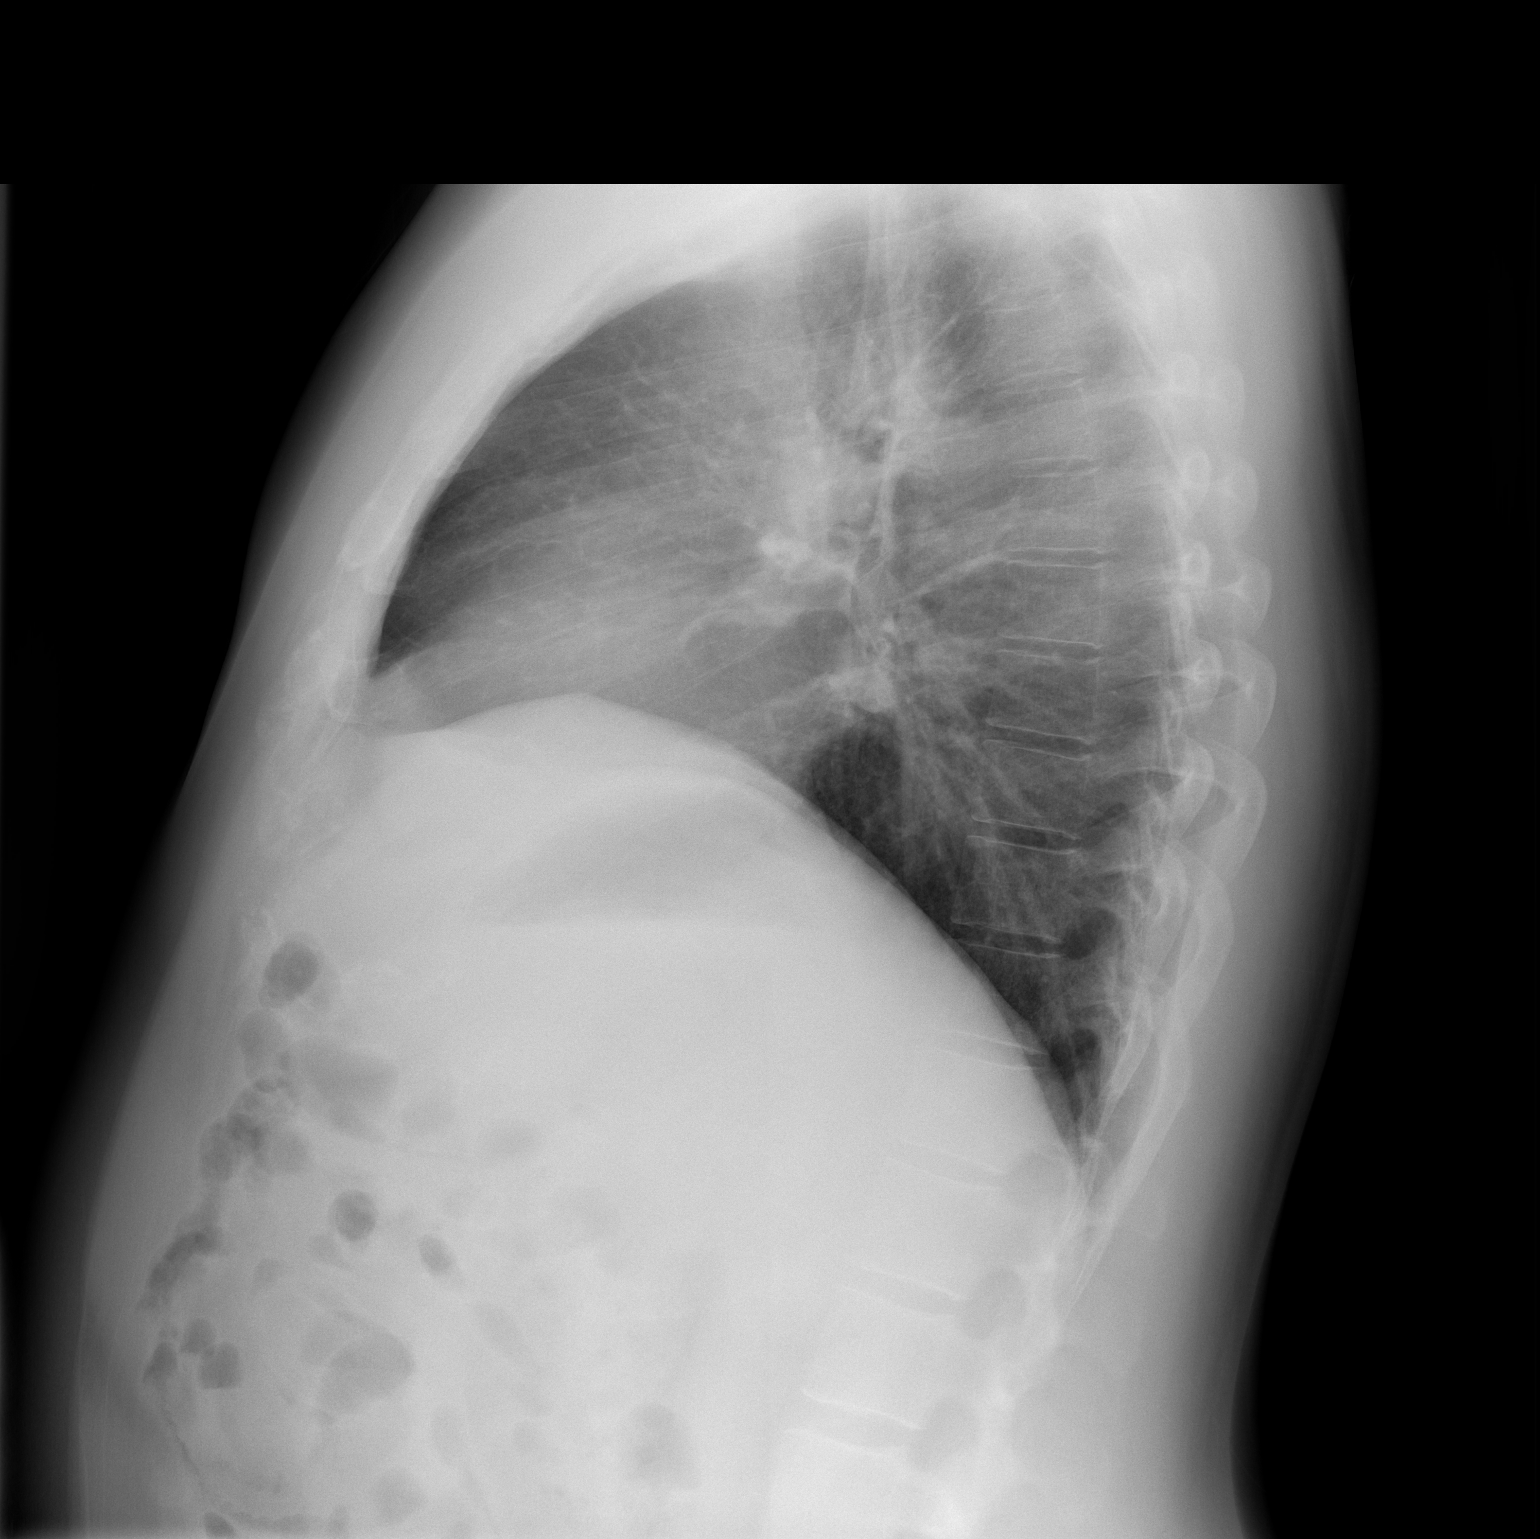

[2 of 2 positions shown; findings below may reference images not displayed]

FINDINGS: The heart size and mediastinal contours are within normal limits.
Both lungs are clear. The visualized skeletal structures are
unremarkable.
IMPRESSION: No active cardiopulmonary disease.
